# Patient Record
Sex: Male | Born: 1950 | Race: White | Hispanic: No | Marital: Single | Smoking: Never smoker
Health system: Southern US, Community
[De-identification: ages and names within clinical notes are randomized; demographics above are authoritative.]

## PROBLEM LIST (undated history)

## (undated) DIAGNOSIS — H409 Unspecified glaucoma: Secondary | ICD-10-CM

## (undated) DIAGNOSIS — I10 Essential (primary) hypertension: Secondary | ICD-10-CM

## (undated) DIAGNOSIS — E119 Type 2 diabetes mellitus without complications: Secondary | ICD-10-CM

## (undated) HISTORY — PX: EYE SURGERY: SHX253

---

## 2005-08-25 ENCOUNTER — Ambulatory Visit (HOSPITAL_COMMUNITY): Admission: RE | Admit: 2005-08-25 | Discharge: 2005-08-25 | Payer: Self-pay | Admitting: Ophthalmology

## 2017-11-05 ENCOUNTER — Other Ambulatory Visit: Payer: Self-pay

## 2017-11-05 ENCOUNTER — Emergency Department (HOSPITAL_COMMUNITY): Payer: BLUE CROSS/BLUE SHIELD

## 2017-11-05 ENCOUNTER — Encounter (HOSPITAL_COMMUNITY): Payer: Self-pay | Admitting: Emergency Medicine

## 2017-11-05 ENCOUNTER — Emergency Department (HOSPITAL_COMMUNITY)
Admission: EM | Admit: 2017-11-05 | Discharge: 2017-11-05 | Disposition: A | Discharge status: Home / Self Care | Payer: BLUE CROSS/BLUE SHIELD | Attending: Emergency Medicine | Admitting: Emergency Medicine

## 2017-11-05 DIAGNOSIS — I10 Essential (primary) hypertension: Secondary | ICD-10-CM | POA: Insufficient documentation

## 2017-11-05 DIAGNOSIS — Z7984 Long term (current) use of oral hypoglycemic drugs: Secondary | ICD-10-CM | POA: Insufficient documentation

## 2017-11-05 DIAGNOSIS — Y929 Unspecified place or not applicable: Secondary | ICD-10-CM | POA: Insufficient documentation

## 2017-11-05 DIAGNOSIS — Y999 Unspecified external cause status: Secondary | ICD-10-CM | POA: Insufficient documentation

## 2017-11-05 DIAGNOSIS — S20219A Contusion of unspecified front wall of thorax, initial encounter: Secondary | ICD-10-CM | POA: Insufficient documentation

## 2017-11-05 DIAGNOSIS — Y9389 Activity, other specified: Secondary | ICD-10-CM | POA: Diagnosis not present

## 2017-11-05 DIAGNOSIS — R079 Chest pain, unspecified: Secondary | ICD-10-CM | POA: Diagnosis present

## 2017-11-05 DIAGNOSIS — Z79899 Other long term (current) drug therapy: Secondary | ICD-10-CM | POA: Diagnosis not present

## 2017-11-05 DIAGNOSIS — E119 Type 2 diabetes mellitus without complications: Secondary | ICD-10-CM | POA: Diagnosis not present

## 2017-11-05 HISTORY — DX: Unspecified glaucoma: H40.9

## 2017-11-05 HISTORY — DX: Essential (primary) hypertension: I10

## 2017-11-05 HISTORY — DX: Type 2 diabetes mellitus without complications: E11.9

## 2017-11-05 LAB — I-STAT CHEM 8, ED
BUN: 15 mg/dL (ref 6–20)
CALCIUM ION: 1.28 mmol/L (ref 1.15–1.40)
CREATININE: 0.7 mg/dL (ref 0.61–1.24)
Chloride: 100 mmol/L — ABNORMAL LOW (ref 101–111)
GLUCOSE: 161 mg/dL — AB (ref 65–99)
HCT: 44 % (ref 39.0–52.0)
HEMOGLOBIN: 15 g/dL (ref 13.0–17.0)
Potassium: 3.5 mmol/L (ref 3.5–5.1)
Sodium: 138 mmol/L (ref 135–145)
TCO2: 25 mmol/L (ref 22–32)

## 2017-11-05 MED ORDER — TRAMADOL HCL 50 MG PO TABS: 50.0000 mg | ORAL_TABLET | Freq: Four times a day (QID) | ORAL | 0 refills | Status: AC | PRN

## 2017-11-05 MED ORDER — SODIUM CHLORIDE 0.9 % IV BOLUS (SEPSIS)
500.0000 mL | Freq: Once | INTRAVENOUS | Status: AC
  Administered 2017-11-05: 500 mL via INTRAVENOUS
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
## 2017-11-05 NOTE — ED Notes (Signed)
Transported to CT 
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
## 2017-11-05 NOTE — Discharge Instructions (Signed)
Follow up with your md next week if needed °

## 2017-11-08 MED ORDER — IOPAMIDOL (ISOVUE-370) INJECTION 76%: 100.0000 mL | Freq: Once | INTRAVENOUS | Status: AC | PRN
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
## 2020-10-23 DEATH — deceased
